# Patient Record
Sex: Female | Born: 1970 | Race: Asian | Hispanic: No | Marital: Married
Health system: Southern US, Community
[De-identification: ages and names within clinical notes are randomized; demographics above are authoritative.]

---

## 2005-07-10 ENCOUNTER — Encounter: Payer: Self-pay | Admitting: Emergency Medicine

## 2005-07-10 ENCOUNTER — Inpatient Hospital Stay (HOSPITAL_COMMUNITY): Admission: AD | Admit: 2005-07-10 | Discharge: 2005-07-14 | Payer: Self-pay | Admitting: Obstetrics

## 2005-09-27 ENCOUNTER — Inpatient Hospital Stay (HOSPITAL_COMMUNITY): Admission: AD | Admit: 2005-09-27 | Discharge: 2005-10-02 | Payer: Self-pay | Admitting: Obstetrics & Gynecology

## 2005-09-29 ENCOUNTER — Encounter (INDEPENDENT_AMBULATORY_CARE_PROVIDER_SITE_OTHER): Payer: Self-pay | Admitting: *Deleted
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
## 2007-07-01 ENCOUNTER — Inpatient Hospital Stay (HOSPITAL_COMMUNITY): Admission: AD | Admit: 2007-07-01 | Discharge: 2007-07-03 | Payer: Self-pay | Admitting: Obstetrics & Gynecology

## 2010-10-07 NOTE — H&P (Signed)
Audrey Leblanc, Audrey Leblanc               ACCOUNT NO.:  [id number]   MEDICAL RECORD NO.:  [id number]          PATIENT TYPE:  INP   LOCATION:  9139                          FACILITY:  [facility]   PHYSICIAN:  Roseanna Rainbow, M.D.DATE OF BIRTH:  Apr 07, 1971   DATE OF ADMISSION:  07/01/2007  DATE OF DISCHARGE:                              HISTORY & PHYSICAL   CHIEF COMPLAINT:  The patient is a 40 year old para 1 with an estimated  date of confinement of July 13, 2007, with an intrauterine pregnancy  at 38+ weeks with a history of a previous cesarean delivery complaining  of bloody show and contractions.   HISTORY OF PRESENT ILLNESS:  Please see the above.   ALLERGIES:  No known drug allergies.   MEDICATIONS:  Prenatal vitamins.   OB RISK FACTORS:  Previous cesarean delivery with a documented low  uterine flap with elliptical cesarean delivery.  She desires a VBAC.   PRENATAL LABORATORIES:  Chlamydia probe negative.  Urine culture and  sensitivity:  Insignificant growth.  Pap smear negative.  GC probe  negative.  Hepatitis B surface antigen negative.  Hematocrit 33.3,  hemoglobin 10.8.  Hemoglobin electrophoresis normal.  HIV nonreactive.  Platelets 188,000.  Blood type is O positive, antibody screen negative,  RPR nonreactive, rubella immune.  GBS negative on January 13.   PAST OBSTETRICAL HISTORY:  In May 2007 she was delivered of a live-born  female, full-term, 7 pounds 1 ounce, via cesarean delivery.   PAST GYN HISTORY:  Noncontributory.   PAST MEDICAL HISTORY:  No significant history of medical diseases.   PAST SURGICAL HISTORY:  Please see the above.   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  She  is married, living with her spouse.  Does not give any significant  history of alcohol usage, has no significant smoking history, denies  illicit drug use.   FAMILY HISTORY:  Hypertension.   PHYSICAL EXAMINATION:  VITAL SIGNS:  Stable, afebrile.  Fetal  heart  tracing reassuring.  Tocodynamometer:  Uterine contractions every 2-4  minutes.  GENERAL:  Uncomfortable.  STERILE VAGINAL EXAM:  She is 3-4 cm dilated, 80% effaced, with vertex  at a -1 station.  The membranes were artificially ruptured, port wine  fluid noted.   ASSESSMENT:  Intrauterine pregnancy at term, history of a previous  cesarean delivery documented low transverse cesarean section, desires a  trial of labor.  Latent labor.  Fetal heart tracing consistent with  fetal well-being.   PLAN:  Admission, expectant management for now.      Roseanna Rainbow, M.D.  Electronically Signed     LAJ/MEDQ  D:  07/01/2007  T:  07/02/2007  Job:  [id number]
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
## 2010-10-10 NOTE — H&P (Signed)
Audrey Leblanc, Audrey Leblanc               ACCOUNT NO.:  [id number]   MEDICAL RECORD NO.:  [id number]          PATIENT TYPE:  INP   LOCATION:  9168                          [facility]ILITY[facility]   PHYSICIAN:  Roseanna Rainbow, M.D.DATE OF BIRTH:  07/09/1970   DATE OF ADMISSION:  09/27/2005  DATE OF DISCHARGE:                                HISTORY & PHYSICAL   CHIEF COMPLAINT:  The patient is a 40 year old, gravida 1, para 0 with an  estimated date of confinement of April 30 with an intrauterine pregnancy at  40-6/7 weeks who presents for induction of labor.   HISTORY OF PRESENT ILLNESS:  Please see the above.   ALLERGIES:  No known drug allergies.   MEDICATIONS:  Prenatal vitamins.   RISK FACTORS:  Urinary tract infection.   LABORATORY DATA:  Prenatal labs, hemoglobin 11.6, hematocrit 34.7, platelets  188,000, blood type O positive, hemoglobin electrophoresis normal. RPR  nonreactive, hepatitis B surface antigen negative. HIV declined. PPD  negative. Rubella immune, urine culture and sensitivity, E. coli, 1 hour GCT  115. GBS negative.   PAT OB/GYN HISTORY:  Noncontributory.   PAST MEDICAL HISTORY:  No significant issues or medical diseases.   PAST SURGICAL HISTORY:  No previous surgeries.   SOCIAL HISTORY:  Employed in Air[employer]. She is married living with her spouse  and does not give any significant history of alcohol usage. Has no  significant smoking history, denies illicit drug use.   FAMILY HISTORY:  No major illnesses known.   PHYSICAL EXAMINATION:  VITAL SIGNS:  Pulse 74, respiratory rate 20, blood  pressure 119/77.  GENERAL:  Well-developed, well-nourished, no apparent distress.  ABDOMEN:  Gravida. Vaginal exam is per the RN. Fetal heart tracing  reassuring.   ASSESSMENT:  Primigravida with an intrauterine pregnancy at 40-6/7 weeks,  unfavorable Bishop score for induction of labor.   PLAN:  Admission, two stage induction with ripening to be followed by  Pitocin and  AROM.      Roseanna Rainbow, M.D.  Electronically Signed     LAJ/MEDQ  D:  09/28/2005  T:  09/28/2005  Job:  [id number]

## 2010-10-10 NOTE — Discharge Summary (Signed)
Audrey Leblanc, Audrey Leblanc               ACCOUNT NO.:  [id number]   MEDICAL RECORD NO.:  [id number]          PATIENT TYPE:  INP   LOCATION:  9144                          [facility]ILITY[facility]   PHYSICIAN:  Charles A. Clearance Coots, M.D.DATE OF BIRTH:  July 20, 1970   DATE OF ADMISSION:  09/27/2005  DATE OF DISCHARGE:  10/02/2005                                 DISCHARGE SUMMARY   ADMITTING DIAGNOSES:  1.  Postdates pregnancy.  2.  Induction of labor.   DISCHARGE DIAGNOSES:  1.  Postdates pregnancy.  2.  Induction of labor.  3.  Status post primary low transverse cesarean section on Sep 29, 2005 for      arrest of dilatation and active phase of labor, delivered a viable female      infant at 43 29, Apgar scores of 9 at 1 minute and 9 at 5 minutes,      weight of 3205 grams, length 50 cm.   CONDITION ON DISCHARGE:  Mother and infant discharged home in good  condition.   REASON FOR ADMISSION:  [age]-year-old G1, P0 Asian female, estimated  date of confinement of September 21, 2005, presents for induction of labor.  Prenatal care was uncomplicated.   PAST MEDICAL HISTORY:   SURGERY:  None.   ILLNESSES:  None.   MEDICATIONS:  Prenatal vitamins.   ALLERGIES:  No known drug allergies.   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   FAMILY HISTORY:  No major illnesses known.   PHYSICAL EXAM:  GENERAL:  Well-nourished, well-developed Asian female in no  acute distress.  VITAL SIGNS:  Afebrile, blood pressure 119/77, pulse 74, respiratory rate  20.  LUNGS:  Clear to auscultation bilaterally.  HEART:  Regular rate and rhythm.  ABDOMEN:  Gravid, nontender.  PELVIC:  Cervix long, closed and the vertex at -3.   ADMITTING LABORATORY VALUES:  Hemoglobin 12.8, hematocrit 36.8, white blood  cell count 9200, platelets 214,000.  RPR was nonreactive.   HOSPITAL COURSE:  The patient was admitted and cervical ripening  was done  overnight with Cervidil.  Pitocin was started the following morning.  The  patient progressed to 8 cm of dilatation with increased molding and the  vertex at that occipotransverse position and made no further progress after  8 cm of dilatation.  She was taken to the operating room for a cesarean  section for arrest of dilatation, active phase of labor.  Primary low  transverse cesarean section was performed without complications.  Postoperative course was uncomplicated.  The patient was discharged home  postop day #3 in good condition.   DISCHARGE LABORATORY VALUES:  Hemoglobin 9.5, hematocrit 27.8, white blood  cell count 14,100, platelets 178,000.   DISCHARGE DISPOSITION:   MEDICATIONS:  Tylox and ibuprofen were prescribed for pain.  Continue  prenatal vitamins.   DISCHARGE INSTRUCTIONS:  Routine written instructions were given for  obstetrical discharge after cesarean section.   FOLLOWUP:  The patient is to call our office for followup appointment in 2  weeks.  Charles A. Clearance Coots, M.D.  Electronically Signed     CAH/MEDQ  D:  10/02/2005  T:  10/03/2005  Job:  [id number]

## 2010-10-10 NOTE — Op Note (Signed)
NAMEBRITTNI, Audrey Leblanc               ACCOUNT NO.:  [id number]   MEDICAL RECORD NO.:  [id number]          PATIENT TYPE:  INP   LOCATION:  9144                          [facility]ILITY[facility]   PHYSICIAN:  Roseanna Rainbow, M.D.DATE OF BIRTH:  1970/12/01   DATE OF PROCEDURE:  09/29/2005  DATE OF DISCHARGE:                                 OPERATIVE REPORT   PREOPERATIVE DIAGNOSIS:  Intrauterine pregnancy at term, arrest of  dilatation, active phase labor.   POSTOP DIAGNOSIS:  Intrauterine pregnancy at term, arrest of dilatation,  active phase labor.   PROCEDURE:  Primary low uterine flap elliptical cesarean delivery via  Pfannenstiel skin incision.   ASSISTANT SURGEON:  Jackson-Moore.   ANESTHESIA:  Epidural.   PATHOLOGY:  Placenta.   ESTIMATED BLOOD LOSS:  800 mL.   COMPLICATIONS:  None.   PROCEDURE:  The patient is taken to the operating room with an epidural  catheter in situ.  She was placed in the dorsal supine position with left  tilt and prepped and draped in usual sterile fashion.  A Pfannenstiel skin  incision was then made at the scalpel and carried down to the underlying  fascia.  Fascia was nicked in the midline.  The fascial incision was then  extended bilaterally.  The rectus muscles were separated in the midline.  The parietal peritoneum was tented up and entered sharply with scalpel.  This incision was then extended superiorly and inferiorly with good  visualization of bladder.  The bladder blade was then placed.  The  vesicouterine peritoneum was tented up and sharply with Metzenbaum scissors.  This incision was made extended bilaterally in the bladder flap created  bluntly.  The bladder blade was replaced.  The lower uterine segment was  incised in a transverse fashion with the scalpel.  This incision was then  extended bluntly.  The infant's head was delivered atraumatically.  The  oropharynx was suctioned with bulb suction.  The cord was clamped and cut.  The  umbilical artery pH was sent.  The pH was 7.33.  Apgars were 9.  The  placenta was then removed.  The intrauterine cavity was evacuated of any  remaining amniotic fluid, clots and debris with moist laparotomy sponge.  Uterine incision was then reapproximated in a running interlocking fashion  using 0 Monocryl.  A second suture of the same was used to place an  imbricating layer.  The gutters were then irrigated.  The parietal  peritoneum was reapproximated in a running fashion using 2-0 Vicryl.  The  fascia was reapproximated in a running fashion and using 0 Vicryl.  The skin  was reapproximated with staples.  At the close of the procedure the  instrument and pack counts were said to be correct x2.  1 gram of cephazolin  to be given at cord clamp.  The patient was taken to the PACU awake and in  stable condition.      Roseanna Rainbow, M.D.  Electronically Signed     LAJ/MEDQ  D:  09/29/2005  T:  09/30/2005  Job:  [id number]

## 2011-02-13 LAB — CBC
HCT: 35 — ABNORMAL LOW
Hemoglobin: 12
MCHC: 33.9
MCHC: 34.3
MCV: 92.8
Platelets: 176
WBC: 9.4

## 2016-03-09 ENCOUNTER — Encounter: Payer: Self-pay | Admitting: *Deleted

## 2017-02-07 ENCOUNTER — Encounter: Payer: Self-pay | Admitting: *Deleted

## 2017-02-07 DIAGNOSIS — Z23 Encounter for immunization: Secondary | ICD-10-CM

## 2018-01-06 ENCOUNTER — Telehealth: Payer: Self-pay | Admitting: *Deleted

## 2018-02-08 ENCOUNTER — Encounter: Payer: Self-pay | Admitting: *Deleted

## 2018-03-01 ENCOUNTER — Encounter: Payer: Self-pay | Admitting: *Deleted

## 2018-11-05 ENCOUNTER — Encounter: Payer: Self-pay | Admitting: *Deleted

## 2018-11-05 NOTE — [facility] Nurse Program (Unsigned)
  Dept: [facility] Nurse Program Note  Date of Encounter: 11/05/2018  Past Medical History: No past medical history on file.  Encounter Details:   C/o Lt hand nerve pain on& off Told might carpal tunnel synrome, to check you tube, if sx persistant, check with MD

## 2019-09-09 ENCOUNTER — Ambulatory Visit: Payer: Self-pay | Attending: Internal Medicine

## 2019-09-09 DIAGNOSIS — Z23 Encounter for immunization: Secondary | ICD-10-CM

## 2019-09-09 NOTE — Progress Notes (Signed)
   Covid-19 Vaccination Clinic  Name:  Audrey Leblanc    MRN: [id number] DOB: 19-May-1971  09/09/2019  Audrey Leblanc was observed post Covid-19 immunization for 15 minutes without incident. She was provided with Vaccine Information Sheet and instruction to access the V-Safe system.   Audrey Leblanc was instructed to call 911 with any severe reactions post vaccine: Marland Kitchen Difficulty breathing  . Swelling of face and throat  . A fast heartbeat  . A bad rash all over body  . Dizziness and weakness   Immunizations Administered    Name Date Dose VIS Date Route   Pfizer COVID-19 Vaccine 09/09/2019  8:22 AM 0.3 mL 05/05/2019 Intramuscular   Manufacturer: [employer], [employer]   Lot: [id number]   NDC: [address]-9

## 2019-10-03 ENCOUNTER — Ambulatory Visit: Payer: Self-pay | Attending: Internal Medicine

## 2019-10-03 DIAGNOSIS — Z23 Encounter for immunization: Secondary | ICD-10-CM

## 2019-10-03 NOTE — Progress Notes (Signed)
   Covid-19 Vaccination Clinic  Name:  Audrey Leblanc    MRN: [id number] DOB: [birth year]  10/03/2019  [name] was observed post Covid-19 immunization for 30 minutes based on pre-vaccination screening without incident. She was provided with Vaccine Information Sheet and instruction to access the V-Safe system.   [name] was instructed to call 911 with any severe reactions post vaccine: Marland Kitchen Difficulty breathing  . Swelling of face and throat  . A fast heartbeat  . A bad rash all over body  . Dizziness and weakness   Immunizations Administered    Name Date Dose VIS Date Route   Pfizer COVID-19 Vaccine 10/03/2019  8:27 AM 0.3 mL 07/19/2018 Intramuscular   Manufacturer: [employer], [employer]   Lot: [id number]   NDC: [address]-5

## 2020-02-03 ENCOUNTER — Encounter: Payer: Self-pay | Admitting: *Deleted

## 2020-06-15 ENCOUNTER — Telehealth: Payer: Self-pay | Admitting: *Deleted

## 2020-06-15 NOTE — Telephone Encounter (Signed)
Pt asked regarding covid test. Her and her family has been cold sx. Requested self test kit via mail which is not available now. Told check [facility]and self isolation for family and self at least 5~7 days. Will f/u

## 2022-03-22 ENCOUNTER — Ambulatory Visit: Payer: Self-pay
